# Patient Record
Sex: Female | Born: 1995
Health system: Southern US, Community
[De-identification: ages and names within clinical notes are randomized; demographics above are authoritative.]

---
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
## 2017-05-05 ENCOUNTER — Ambulatory Visit (INDEPENDENT_AMBULATORY_CARE_PROVIDER_SITE_OTHER): Payer: No Typology Code available for payment source | Admitting: Family Medicine

## 2017-05-05 ENCOUNTER — Other Ambulatory Visit: Payer: Self-pay

## 2017-05-05 ENCOUNTER — Encounter: Payer: Self-pay | Admitting: Family Medicine

## 2017-05-05 VITALS — BP 110/62 | HR 109 | Temp 99.8°F | Resp 16 | Ht 60.25 in | Wt 156.0 lb

## 2017-05-05 DIAGNOSIS — J069 Acute upper respiratory infection, unspecified: Secondary | ICD-10-CM | POA: Diagnosis not present

## 2017-05-05 DIAGNOSIS — J301 Allergic rhinitis due to pollen: Secondary | ICD-10-CM

## 2017-05-05 MED ORDER — FLUTICASONE PROPIONATE 50 MCG/ACT NA SUSP: 2.0000 | Freq: Every day | NASAL | 6 refills | Status: DC

## 2017-05-05 MED ORDER — CETIRIZINE HCL 10 MG PO TABS: 10.0000 mg | ORAL_TABLET | Freq: Every day | ORAL | 11 refills | Status: DC

## 2017-05-05 NOTE — Progress Notes (Signed)
Chief Complaint  Patient presents with  . URI onset: 05/02/2017    cold symptoms, no fevers noticed, pt is having menses as welll and says she is warmer during menses.  Symptoms have been intermittent, bothers her more at night.   Coughing, sneezing, congestion and stuffy, non productive cough.  Taking nyquil for symptoms helps some but symptoms come back.  ? allergies    HPI  Pt reports that she has been having coughing, sneezing, congestion, stuffy nose, dry cough Onset 05/02/17 She has a history of seasonal allergies She is currently menstruating and feels very tired She tried otc cold meds without much improvement  Patient's last menstrual period was 05/02/2017.    History reviewed. No pertinent past medical history.  Current Outpatient Medications  Medication Sig Dispense Refill  . cetirizine (ZYRTEC) 10 MG tablet Take 1 tablet (10 mg total) by mouth daily. 30 tablet 11  . fluticasone (FLONASE) 50 MCG/ACT nasal spray Place 2 sprays into both nostrils daily. 16 g 6  . Multiple Vitamin (MULTIVITAMIN) capsule Take 2 capsules by mouth daily. Gummy-vites     No current facility-administered medications for this visit.     Allergies: No Known Allergies  History reviewed. No pertinent surgical history.  Social History   Socioeconomic History  . Marital status: Single    Spouse name: Not on file  . Number of children: Not on file  . Years of education: Not on file  . Highest education level: Not on file  Occupational History  . Not on file  Social Needs  . Financial resource strain: Not on file  . Food insecurity:    Worry: Not on file    Inability: Not on file  . Transportation needs:    Medical: Not on file    Non-medical: Not on file  Tobacco Use  . Smoking status: Never Smoker  . Smokeless tobacco: Never Used  Substance and Sexual Activity  . Alcohol use: No  . Drug use: No  . Sexual activity: Not on file  Lifestyle  . Physical activity:    Days per week:  Not on file    Minutes per session: Not on file  . Stress: Not on file  Relationships  . Social connections:    Talks on phone: Not on file    Gets together: Not on file    Attends religious service: Not on file    Active member of club or organization: Not on file    Attends meetings of clubs or organizations: Not on file    Relationship status: Not on file  Other Topics Concern  . Not on file  Social History Narrative  . Not on file    History reviewed. No pertinent family history.   ROS Review of Systems See HPI Constitution: No fevers or chills No malaise No diaphoresis Skin: No rash or itching Eyes: no blurry vision, no double vision GU: no dysuria or hematuria Neuro: no dizziness or headaches * all others reviewed and negative   Objective: Vitals:   05/05/17 1651  BP: 110/62  Pulse: (!) 109  Resp: 16  Temp: 99.8 F (37.7 C)  TempSrc: Oral  SpO2: 99%  Weight: 156 lb (70.8 kg)  Height: 5' 0.25" (1.53 m)    Physical Exam General: alert, oriented, in NAD Head: normocephalic, atraumatic, no sinus tenderness Eyes: EOM intact, no scleral icterus or conjunctival injection Ears: TM clear bilaterally Nose: mucosa +erythematous,+edematous Throat: no pharyngeal exudate or erythema Lymph: no posterior auricular, submental  or cervical lymph adenopathy Heart: normal rate, normal sinus rhythm, no murmurs Lungs: clear to auscultation bilaterally, no wheezing    Assessment and Plan Gloria Schneider was seen today for uri onset: 05/02/2017.  Diagnoses and all orders for this visit:  Acute URI Seasonal allergic rhinitis due to pollen  Supportive care advised No abx indicated  -     fluticasone (FLONASE) 50 MCG/ACT nasal spray; Place 2 sprays into both nostrils daily. -     cetirizine (ZYRTEC) 10 MG tablet; Take 1 tablet (10 mg total) by mouth daily.     Tanique Matney A Javonnie Illescas

## 2017-05-05 NOTE — Patient Instructions (Addendum)
   IF you received an x-ray today, you will receive an invoice from [facility]. Please contact [facility] at [phone] with questions or concerns regarding your invoice.   IF you received labwork today, you will receive an invoice from [facility]. Please contact [facility] at [phone] with questions or concerns regarding your invoice.   Our billing staff will not be able to assist you with questions regarding bills from these companies.  You will be contacted with the lab results as soon as they are available. The fastest way to get your results is to activate your My Chart account. Instructions are located on the last page of this paperwork. If you have not heard from us regarding the results in 2 weeks, please contact this office.    Allergic Rhinitis, Adult Allergic rhinitis is an allergic reaction that affects the mucous membrane inside the nose. It causes sneezing, a runny or stuffy nose, and the feeling of mucus going down the back of the throat (postnasal drip). Allergic rhinitis can be mild to severe. There are two types of allergic rhinitis:  Seasonal. This type is also called hay fever. It happens only during certain seasons.  Perennial. This type can happen at any time of the year.  What are the causes? This condition happens when the body's defense system (immune system) responds to certain harmless substances called allergens as though they were germs.  Seasonal allergic rhinitis is triggered by pollen, which can come from grasses, trees, and weeds. Perennial allergic rhinitis may be caused by:  House dust mites.  Pet dander.  Mold spores.  What are the signs or symptoms? Symptoms of this condition include:  Sneezing.  Runny or stuffy nose (nasal congestion).  Postnasal drip.  Itchy nose.  Tearing of the eyes.  Trouble sleeping.  Daytime sleepiness.  How is this diagnosed? This condition may be diagnosed based on:  Your medical  history.  A physical exam.  Tests to check for related conditions, such as: ? Asthma. ? Pink eye. ? Ear infection. ? Upper respiratory infection.  Tests to find out which allergens trigger your symptoms. These may include skin or blood tests.  How is this treated? There is no cure for this condition, but treatment can help control symptoms. Treatment may include:  Taking medicines that block allergy symptoms, such as antihistamines. Medicine may be given as a shot, nasal spray, or pill.  Avoiding the allergen.  Desensitization. This treatment involves getting ongoing shots until your body becomes less sensitive to the allergen. This treatment may be done if other treatments do not help.  If taking medicine and avoiding the allergen does not work, new, stronger medicines may be prescribed.  Follow these instructions at home:  Find out what you are allergic to. Common allergens include smoke, dust, and pollen.  Avoid the things you are allergic to. These are some things you can do to help avoid allergens: ? Replace carpet with wood, tile, or vinyl flooring. Carpet can trap dander and dust. ? Do not smoke. Do not allow smoking in your home. ? Change your heating and air conditioning filter at least once a month. ? During allergy season:  Keep windows closed as much as possible.  Plan outdoor activities when pollen counts are lowest. This is usually during the evening hours.  When coming indoors, change clothing and shower before sitting on furniture or bedding.  Take over-the-counter and prescription medicines only as told by your health care provider.  Keep all   follow-up visits as told by your health care provider. This is important. Contact a health care provider if:  You have a fever.  You develop a persistent cough.  You make whistling sounds when you breathe (you wheeze).  Your symptoms interfere with your normal daily activities. Get help right away if:  You  have shortness of breath. Summary  This condition can be managed by taking medicines as directed and avoiding allergens.  Contact your health care provider if you develop a persistent cough or fever.  During allergy season, keep windows closed as much as possible. This information is not intended to replace advice given to you by your health care provider. Make sure you discuss any questions you have with your health care provider. Document Released: 10/20/2000 Document Revised: 03/04/2016 Document Reviewed: 03/04/2016 Elsevier Interactive Patient Education  2018 [employer] Inc[employer]  

## 2017-09-19 ENCOUNTER — Other Ambulatory Visit: Payer: Self-pay

## 2017-09-19 ENCOUNTER — Encounter (HOSPITAL_BASED_OUTPATIENT_CLINIC_OR_DEPARTMENT_OTHER): Payer: Self-pay | Admitting: Emergency Medicine

## 2017-09-19 ENCOUNTER — Emergency Department (HOSPITAL_BASED_OUTPATIENT_CLINIC_OR_DEPARTMENT_OTHER)
Admission: EM | Admit: 2017-09-19 | Discharge: 2017-09-19 | Disposition: A | Discharge status: Home / Self Care | Payer: No Typology Code available for payment source | Attending: Emergency Medicine | Admitting: Emergency Medicine

## 2017-09-19 DIAGNOSIS — Y9389 Activity, other specified: Secondary | ICD-10-CM | POA: Insufficient documentation

## 2017-09-19 DIAGNOSIS — Y999 Unspecified external cause status: Secondary | ICD-10-CM | POA: Insufficient documentation

## 2017-09-19 DIAGNOSIS — Z79899 Other long term (current) drug therapy: Secondary | ICD-10-CM | POA: Diagnosis not present

## 2017-09-19 DIAGNOSIS — M549 Dorsalgia, unspecified: Secondary | ICD-10-CM | POA: Insufficient documentation

## 2017-09-19 DIAGNOSIS — Y92129 Unspecified place in nursing home as the place of occurrence of the external cause: Secondary | ICD-10-CM | POA: Insufficient documentation

## 2017-09-19 DIAGNOSIS — X500XXA Overexertion from strenuous movement or load, initial encounter: Secondary | ICD-10-CM | POA: Diagnosis not present

## 2017-09-19 MED ORDER — METHOCARBAMOL 500 MG PO TABS: 500.0000 mg | ORAL_TABLET | Freq: Every evening | ORAL | 0 refills | Status: DC | PRN

## 2017-09-19 MED ORDER — MELOXICAM 15 MG PO TABS: 15.0000 mg | ORAL_TABLET | Freq: Every day | ORAL | 0 refills | Status: DC

## 2017-09-19 MED FILL — MELOXICAM 15 MG TABLET: 15 | 30 days supply | Qty: 30 | Fill #0

## 2017-09-19 MED FILL — METHOCARBAMOL 500 MG TABLET: 500 | 14 days supply | Qty: 14 | Fill #0

## 2017-09-19 NOTE — ED Notes (Signed)
Pt. Able to move her Neck L to R and move her L arm with no difficulty.  Pt. Talks non stop about her job being difficult with issues of her not having help and being uncomfortable with her position in her role with the care she has to provide.  Pt. Continues to discuss the issues of the job not the reason she is here and the back pain she came to the ED for.  EDP redirects the Pt. To her reason for being here.

## 2017-09-19 NOTE — Discharge Instructions (Addendum)
You were seen here today for upper back pain. Your exam was reassuring. Follow attached handout.  Please take medications as prescribed. Do not drive while taking robaxin. Please follow up with sports medicine as needed if symptoms continue.  Return for fever, new weakness, new numbness, loss of bowel or bladder control or other concerning symptoms.

## 2017-09-19 NOTE — ED Triage Notes (Signed)
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
## 2017-09-19 NOTE — ED Notes (Signed)
ED Provider at bedside. 

## 2017-09-19 NOTE — ED Provider Notes (Signed)
[facility] EMERGENCY DEPARTMENT Provider Note   CSN: [id number] Arrival date & time: 09/19/17  1310     History   Chief Complaint Chief Complaint  Patient presents with  . Back Pain    HPI Gloria Schneider M Taulbee is a 22 y.o. female with no significant past medical history presents to the emergency department today for left upper back pain yesterday.  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  She reports that since that time she has been having pain of her left upper back just underneath her scapula.  She notes it is worse with palpation as well as certain movements of her shoulder.  She denies any neck pain, shoulder pain or midline back pain.  No bowel/bladder incontinence, urinary retention, numbness/tingling/weakness of the upper or lower extremities.  Patient is taking Aleve for symptoms of mild relief.  She continually reports how she feels comfortable at work and has to be redirected.  No other complaints at this time.  HPI  History reviewed. No pertinent past medical history.  There are no active problems to display for this patient.   History reviewed. No pertinent surgical history.   OB History   None      Home Medications    Prior to Admission medications   Medication Sig Start Date End Date Taking? Authorizing Provider  cetirizine (ZYRTEC) 10 MG tablet Take 1 tablet (10 mg total) by mouth daily. 05/05/17   Doristine BosworthStallings, Zoe A, MD  fluticasone (FLONASE) 50 MCG/ACT nasal spray Place 2 sprays into both nostrils daily. 05/05/17   Doristine BosworthStallings, Zoe A, MD  Multiple Vitamin (MULTIVITAMIN) capsule Take 2 capsules by mouth daily. Gummy-vites    [provider]    Family History History reviewed. No pertinent family history.  Social History Social History   Tobacco Use  . Smoking status: Never Smoker  . Smokeless tobacco: Never Used  Substance Use Topics  . Alcohol  use: No  . Drug use: No     Allergies   Patient has no known allergies.   Review of Systems Review of Systems  All other systems reviewed and are negative.    Physical Exam Updated Vital Signs BP 126/84 (BP Location: Left Arm)   Pulse 91   Resp 18   Ht 5' (1.524 m)   Wt 65.8 kg   SpO2 100%   BMI 28.32 kg/m   Physical Exam  Constitutional: She appears well-developed and well-nourished.  HENT:  Head: Normocephalic and atraumatic.  Right Ear: External ear normal.  Left Ear: External ear normal.  Eyes: Conjunctivae are normal. Right eye exhibits no discharge. Left eye exhibits no discharge. No scleral icterus.  Neck: No JVD present. Carotid bruit is not present.  No C-spine tenderness palpation or step-offs.  Cardiovascular:  Pulses:      Radial pulses are 2+ on the right side, and 2+ on the left side.  Pulmonary/Chest: Effort normal. No respiratory distress.  Musculoskeletal:       Left shoulder: Normal. She exhibits normal range of motion and no tenderness.       Cervical back: Normal.       Thoracic back: She exhibits tenderness. She exhibits no swelling.       Lumbar back: Normal.       Back:  No C, T, L-spine tenderness palpation or step-offs.  Neurological: She is alert. She has normal  strength. No sensory deficit.  Normal range of motion of bilateral upper extremities.  She has intact sensation to light touch.  Normal strength of bilateral shoulders with abduction.  Skin: Skin is warm, dry and intact. Capillary refill takes less than 2 seconds. No pallor.  Psychiatric: She has a normal mood and affect.  Nursing note and vitals reviewed.    ED Treatments / Results  Labs (all labs ordered are listed, but only abnormal results are displayed) Labs Reviewed - No data to display  EKG None  Radiology No results found.  Procedures Procedures (including critical care time)  Medications Ordered in ED Medications - No data to display   Initial  Impression / Assessment and Plan / ED Course  I have reviewed the triage vital signs and the nursing notes.  Pertinent labs & imaging results that were available during my care of the patient were reviewed by me and considered in my medical decision making (see chart for details).     22 y.o. with pain over here aspect of her left scapula after catching patient was falling the other day.  She has no bony tenderness.  She denies any neurologic symptoms.  She has normal neurologic exam.  She is neurovascular intact. No bowel/bladder incontinence.  No concern for cauda equina.  Suspect musculoskeletal pain.  No imaging negative.  Recommended rest, conservative therapy and follow-up with sports medicine versus PCP.  Return precautions discussed.  Patient appears safe for discharge.  Final Clinical Impressions(s) / ED Diagnoses   Final diagnoses:  Upper back pain on left side    ED Discharge Orders         Ordered    meloxicam (MOBIC) 15 MG tablet  Daily     09/19/17 1519    methocarbamol (ROBAXIN) 500 MG tablet  At bedtime PRN     09/19/17 1519           Princella PellegriniMaczis, Sundra Haddix M, PA-C 09/19/17 1540    Tegeler, Canary Brimhristopher J, MD 09/19/17 [phone]

## 2017-09-20 ENCOUNTER — Other Ambulatory Visit: Payer: Self-pay

## 2017-09-20 ENCOUNTER — Telehealth: Payer: Self-pay

## 2017-09-20 ENCOUNTER — Encounter: Payer: Self-pay | Admitting: Family Medicine

## 2017-09-20 ENCOUNTER — Ambulatory Visit (INDEPENDENT_AMBULATORY_CARE_PROVIDER_SITE_OTHER): Payer: Worker's Compensation | Admitting: Family Medicine

## 2017-09-20 VITALS — BP 124/86 | HR 100 | Temp 98.6°F | Ht 60.0 in | Wt 142.2 lb

## 2017-09-20 DIAGNOSIS — S29012A Strain of muscle and tendon of back wall of thorax, initial encounter: Secondary | ICD-10-CM | POA: Diagnosis not present

## 2017-09-20 DIAGNOSIS — M549 Dorsalgia, unspecified: Secondary | ICD-10-CM | POA: Diagnosis not present

## 2017-09-20 NOTE — Telephone Encounter (Signed)
Pt was given Januvia 100mg . Medication is 444.00 with insurance. Requesting a different medication

## 2017-09-20 NOTE — Progress Notes (Signed)
8/13/20193:42 PM  Dimitri PedWiata M Marlar 07-Dec-1995, 22 y.o. female [id number]  Chief Complaint  Patient presents with  . Back Pain    injured at work while lifting a patient. Works at [employer]. While bathing a pt, pt slipped which hurt her back. The 2nd incident a pt fell while she was trying to lift her for bathroom visit. Having pain in the upper back. Taking aleave for the painj Has no problem with going to work, just needs restrictions    HPI:   Patient is a 22 y.o. 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   Fall Risk  09/20/2017 05/05/2017  Falls in the past year? No No     Dep[facility] screen [facility] 09/20/2017 05/05/2017  Decreased Interest 0 0  Down, Depressed, Hopeless 0 0  PHQ - 2 Score 0 0    No Known Allergies  Prior to Admission medications   Medication Sig Start Date End Date Taking? Authorizing Provider  meloxicam (MOBIC) 15 MG tablet Take 1 tablet (15 mg total) by mouth daily. 09/19/17  Yes Maczis, Elmer SowMichael M, PA-C  methocarbamol (ROBAXIN) 500 MG tablet Take 1 tablet (500 mg total) by mouth at bedtime as needed for muscle spasms. 09/19/17  Yes Maczis, Elmer SowMichael M, PA-C  cetirizine (ZYRTEC) 10 MG tablet Take 1 tablet (10 mg total) by mouth daily. Patient not taking: Reported on 09/20/2017 05/05/17   Doristine BosworthStallings, Zoe A, MD  Multiple Vitamin (MULTIVITAMIN) capsule Take 2 capsules by mouth daily. Gummy-vites    [provider]    History reviewed. No pertinent past medical  history.  History reviewed. No pertinent surgical history.  Social History   Tobacco Use  . Smoking status: Never Smoker  . Smokeless tobacco: Never Used  Substance Use Topics  . Alcohol use: No    History reviewed. No pertinent family history.  ROS Per hpi  OBJECTIVE:  Blood pressure 124/86, pulse 100, temperature 98.6 F (37 C), temperature source Oral, height 5' (1.524 m), weight 142 lb 3.2 oz (64.5 kg), last menstrual period 09/17/2017, SpO2 100 %. Body mass index is 27.77 kg/m.   Physical Exam  Constitutional: She is oriented to person, place, and time. She appears well-developed and well-nourished.  HENT:  Head: Normocephalic and atraumatic.  Mouth/Throat: Oropharynx is clear and moist. No oropharyngeal exudate.  Eyes: Pupils are equal, round, and reactive to light. EOM are normal. No scleral icterus.  Neck: Neck supple.  Cardiovascular: Normal rate, regular rhythm and normal heart sounds. Exam reveals no gallop and no friction rub.  No murmur heard. Pulmonary/Chest: Effort normal and breath sounds normal. She has no wheezes. She has no rales.  Musculoskeletal: She exhibits no edema.       Cervical back: Normal.       Thoracic back: She exhibits tenderness. She exhibits no bony tenderness.       Back:  Neurological: She is alert and oriented to person, place, and time.  Skin: Skin is warm and dry.  Nursing note and vitals reviewed.   ASSESSMENT and PLAN  1. Upper back pain on left side 2. Muscle strain of left upper back, initial encounter Continue with conservative measures, take meds rx by urgent care as needed. Work restrictions for no lifting more than 10 lbs over next 2 weeks given. Patient educational handout given.   Return in about 2 weeks (around 10/04/2017).    Myles LippsIrma M Santiago, MD Primary [facility] at [facility] [address] [place], [place] [address] Ph.  [phone] Fax [phone]

## 2017-09-20 NOTE — Telephone Encounter (Signed)
error 

## 2017-09-20 NOTE — Patient Instructions (Signed)
  I will contact you with your lab results within the next 2 weeks.  If you have not heard from us then please contact us. The fastest way to get your results is to register for My Chart.   IF you received an x-ray today, you will receive an invoice from [facility]. Please contact [facility] at [phone] with questions or concerns regarding your invoice.   IF you received labwork today, you will receive an invoice from [facility]. Please contact [facility] at [phone] with questions or concerns regarding your invoice.   Our billing staff will not be able to assist you with questions regarding bills from these companies.  You will be contacted with the lab results as soon as they are available. The fastest way to get your results is to activate your My Chart account. Instructions are located on the last page of this paperwork. If you have not heard from us regarding the results in 2 weeks, please contact this office.     

## 2017-09-21 ENCOUNTER — Encounter: Payer: Self-pay | Admitting: Family Medicine

## 2017-10-05 ENCOUNTER — Telehealth: Payer: Self-pay | Admitting: Family Medicine

## 2017-10-05 ENCOUNTER — Other Ambulatory Visit: Payer: Self-pay

## 2017-10-05 ENCOUNTER — Ambulatory Visit (INDEPENDENT_AMBULATORY_CARE_PROVIDER_SITE_OTHER): Payer: Worker's Compensation | Admitting: Family Medicine

## 2017-10-05 ENCOUNTER — Encounter: Payer: Self-pay | Admitting: Family Medicine

## 2017-10-05 VITALS — BP 115/87 | HR 100 | Temp 98.6°F | Ht 60.0 in | Wt 146.0 lb

## 2017-10-05 DIAGNOSIS — M549 Dorsalgia, unspecified: Secondary | ICD-10-CM | POA: Diagnosis not present

## 2017-10-05 DIAGNOSIS — S29012D Strain of muscle and tendon of back wall of thorax, subsequent encounter: Secondary | ICD-10-CM

## 2017-10-05 NOTE — Progress Notes (Signed)
   8/28/20193:40 PM  Dimitri PedWiata M Fiscus Aug 05, 1995, 22 y.o. female [id number]  Chief Complaint  Patient presents with  . Back Pain    no longer taking the Mobic due to how it makes her feel. Never started the Robaxin. Using heat and taking aleeve for the pain.     HPI:   Patient is a 22 y.o. female who presents today for left upper back pain from work related incidents around early august 2019  Last seen Sep 20 2017  Took mobic at bedtime, slept really well, but was very groogy the next days Has not taken muscle relaxant Doing heating pads, stretching, aleve Still feels stiff specially if bending over  Has not heard back from work regarding when to return to work   Fall Risk  10/05/2017 09/20/2017 05/05/2017  Falls in the past year? No No No     [facility] 10/05/2017 09/20/2017 05/05/2017  Decreased Interest 0 0 0  Down, Depressed, Hopeless 0 0 0  PHQ - 2 Score 0 0 0    No Known Allergies  Prior to Admission medications   Medication Sig Start Date End Date Taking? Authorizing Provider  Multiple Vitamin (MULTIVITAMIN) capsule Take 2 capsules by mouth daily. Gummy-vites   Yes [provider]  meloxicam (MOBIC) 15 MG tablet Take 1 tablet (15 mg total) by mouth daily. Patient not taking: Reported on 10/05/2017 09/19/17   Maczis, Elmer SowMichael M, PA-C  methocarbamol (ROBAXIN) 500 MG tablet Take 1 tablet (500 mg total) by mouth at bedtime as needed for muscle spasms. Patient not taking: Reported on 10/05/2017 09/19/17   Jacinto HalimMaczis, Michael M, PA-C    History reviewed. No pertinent past medical history.  History reviewed. No pertinent surgical history.  Social History   Tobacco Use  . Smoking status: Never Smoker  . Smokeless tobacco: Never Used  Substance Use Topics  . Alcohol use: No    History reviewed. No pertinent family history.  ROS Per hpi  OBJECTIVE:  Blood pressure 115/87, pulse 100, temperature 98.6 F (37 C), temperature source Oral, height 5'  (1.524 m), weight 146 lb (66.2 kg), last menstrual period 09/17/2017, SpO2 99 %. Body mass index is 28.51 kg/m.   Physical Exam  Constitutional: She is oriented to person, place, and time. She appears well-developed and well-nourished.  HENT:  Head: Normocephalic and atraumatic.  Mouth/Throat: Mucous membranes are normal.  Eyes: Pupils are equal, round, and reactive to light. Conjunctivae and EOM are normal. No scleral icterus.  Neck: Neck supple.  Pulmonary/Chest: Effort normal.  Musculoskeletal:       Thoracic back: She exhibits decreased range of motion, tenderness, swelling and spasm. She exhibits no bony tenderness.       Back:  Neurological: She is alert and oriented to person, place, and time.  Skin: Skin is warm and dry.  Psychiatric: She has a normal mood and affect.  Nursing note and vitals reviewed.   ASSESSMENT and PLAN  1. Upper back pain on left side 2. Muscle strain of left upper back, subsequent encounter - Ambulatory referral to Physical Therapy Continue with supportive measures. Referring to PT to further eval and treat.   Return in about 4 weeks (around 11/02/2017).    Myles LippsIrma M Santiago, MD [facility] at [facility] [address] [place], [place] [address] Ph.  [phone] Fax [phone]

## 2017-10-05 NOTE — Patient Instructions (Signed)
° ° ° °  If you have lab work done today you will be contacted with your lab results within the next 2 weeks.  If you have not heard from us then please contact us. The fastest way to get your results is to register for My Chart. ° ° °IF you received an x-ray today, you will receive an invoice from [facility]. Please contact [facility] at [phone] with questions or concerns regarding your invoice.  ° °IF you received labwork today, you will receive an invoice from [facility]. Please contact [facility] at [phone] with questions or concerns regarding your invoice.  ° °Our billing staff will not be able to assist you with questions regarding bills from these companies. ° °You will be contacted with the lab results as soon as they are available. The fastest way to get your results is to activate your My Chart account. Instructions are located on the last page of this paperwork. If you have not heard from us regarding the results in 2 weeks, please contact this office. °  ° ° ° °

## 2017-10-05 NOTE — Telephone Encounter (Signed)
Pt w/c adjustor is Gloria Schneider telephone number is [phone] and fax is [phone] contact for any referral send outs

## 2017-10-31 ENCOUNTER — Encounter: Payer: Self-pay | Admitting: Rehabilitative and Restorative Service Providers"

## 2017-10-31 ENCOUNTER — Ambulatory Visit (INDEPENDENT_AMBULATORY_CARE_PROVIDER_SITE_OTHER): Payer: No Typology Code available for payment source | Admitting: Rehabilitative and Restorative Service Providers"

## 2017-10-31 DIAGNOSIS — R293 Abnormal posture: Secondary | ICD-10-CM

## 2017-10-31 DIAGNOSIS — R29898 Other symptoms and signs involving the musculoskeletal system: Secondary | ICD-10-CM | POA: Diagnosis not present

## 2017-10-31 DIAGNOSIS — M546 Pain in thoracic spine: Secondary | ICD-10-CM
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
## 2017-10-31 NOTE — Patient Instructions (Signed)
Access Code: [email or web address]  URL: [email or web address]  Date: 10/31/2017  Prepared by: Corlis Leakelyn Damire Remedios   Exercises  Seated Cervical Retraction - 10 reps - 1 sets - 3x daily - 7x weekly  Standing Scapular Retraction - 10 reps - 1 sets - 10 hold - 3x daily - 7x weekly  Shoulder External Rotation and Scapular Retraction - 10 reps - 1 sets - hold - 3x daily - 7x weekly  Standing Scapular Retraction in Abduction - 10 reps - 1 sets - 3x daily - 7x weekly  Doorway Pec Stretch at 60 Degrees Abduction - 3 reps - 1 sets - 3x daily - 7x weekly  Doorway Pec Stretch at 90 Degrees Abduction - 3 reps - 1 sets - 30 seconds hold - 3x daily - 7x weekly  Doorway Pec Stretch at 120 Degrees Abduction - 3 reps - 1 sets - 30 second hold hold - 3x daily - 7x weekly  Patient Education  TENS UNIT

## 2017-11-04 ENCOUNTER — Encounter: Payer: Self-pay | Admitting: Physical Therapy

## 2017-11-04 ENCOUNTER — Ambulatory Visit (INDEPENDENT_AMBULATORY_CARE_PROVIDER_SITE_OTHER): Payer: No Typology Code available for payment source | Admitting: Physical Therapy

## 2017-11-04 DIAGNOSIS — R293 Abnormal posture: Secondary | ICD-10-CM | POA: Diagnosis not present

## 2017-11-04 DIAGNOSIS — R29898 Other symptoms and signs involving the musculoskeletal system: Secondary | ICD-10-CM | POA: Diagnosis not present

## 2017-11-04 DIAGNOSIS — M546 Pain in thoracic spine: Secondary | ICD-10-CM | POA: Diagnosis not present

## 2017-11-04 NOTE — Patient Instructions (Signed)
Sash   On back, knees bent, feet flat, left hand on left hip, right hand above left. Pull right arm DIAGONALLY (hip to shoulder) across chest. Bring right arm along head toward floor. Hold momentarily. Slowly return to starting position. Repeat _10__ times, 2 sets. Do with left, then right arm. Band color __red____   Resisted External Rotation: in Neutral - Bilateral  PALMS UP!!! Sit or stand, tubing in both hands, elbows at sides, bent to 90, forearms forward. Pinch shoulder blades together and rotate forearms out. Keep elbows at sides. Repeat __10__ times per set. Do __2-3__ sets per session. Do __3-4__ sessions per week.  Resistive Band Rowing   With resistive band anchored in door, grasp both ends. Keeping elbows bent, pull back, squeezing shoulder blades together. Hold _3-5___ seconds. Repeat _10-30___ times. Do __3-4__ sessions per week.  Strengthening: Resisted Extension    Hold tubing with both hands, arms forward. Pull arms back, elbow straight. Repeat _10-30___ times per set. Do ____ sets per session. Do _3-4___ sessions per week.   [facility] [address] [place], [place] [address]  [phone] (office) [phone] (fax) .

## 2017-11-04 NOTE — Therapy (Signed)
[facility] [place] [address] [place], [place], [address] Phone: [phone]   Fax:  [phone]  Physical Therapy Treatment  Patient Details  Name: Gloria Schneider MRN: [id number] Date of Birth: 08-15-1995 Referring Provider (PT): Dr Koren Shiver    Encounter Date: 11/04/2017  PT End of Session - 11/04/17 1148    Visit Number  2    Number of Visits  12    Date for PT Re-Evaluation  12/12/17    PT Start Time  1149    PT Stop Time  1236    PT Time Calculation (min)  47 min    Activity Tolerance  Patient tolerated treatment well;No increased pain    Behavior During Therapy  [facility] for tasks assessed/performed       History reviewed. No pertinent past medical history.  History reviewed. No pertinent surgical history.  There were no vitals filed for this visit.  Subjective Assessment - 11/04/17 1153    Subjective  Pt reports she hasn't had much time to do the exercise. She is feeling a little better.  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     Currently in Pain?  No/denies    Pain Score  0-No pain    Pain Location  Thoracic    Pain Orientation  Left       [facility] Adult PT Treatment/Exercise - 11/04/17 0001      Exercises   Exercises  Shoulder      Shoulder Exercises: Supine   Other Supine Exercises  Lt sash with yellow band x 10 reps       Shoulder Exercises: Seated   Other Seated Exercises  thoracic extension over back of chair x 5 sec x 4 reps      Shoulder Exercises: Standing   External Rotation  Strengthening;Both;10 reps;Theraband    Theraband Level (Shoulder External Rotation)  Level 1 (Yellow)    Extension  Strengthening;Both;10 reps;Theraband    Theraband Level (Shoulder Extension)  Level 1 (Yellow)    Row  Strengthening;Both;10 reps;Theraband    Theraband Level (Shoulder Row)  Level 2 (Red)    Other Standing Exercises  scap squeeze 10 sec x 10; chin tuck  (demo and cues for  form) 5 sec x 10;  W's x 10 with noodle       Shoulder Exercises: ROM/Strengthening   UBE (Upper Arm Bike)  L1: 1.5 min forward/ 1.5 min backward      Shoulder Exercises: Stretch   Other Shoulder Stretches  3 way pec stretch 30 sec x 2 reps each position - cues for technique and form.       Moist Heat Therapy   Number Minutes Moist Heat  15 Minutes    Moist Heat Location  Shoulder   upper thoracic      Electrical Stimulation   Electrical Stimulation Location  Lt posterior shoulder girdle/scapular area     Electrical Stimulation Action  IFC    Electrical Stimulation Parameters  to tolerance    Electrical Stimulation Goals  Pain;Tone                  PT Long Term Goals - 10/31/17 1236      PT LONG TERM GOAL #1   Title  Decrease pain in Lt thoracic area by 50-75% allowing patient to return to normal funcitonal and work activities 12/12/17    Time  6    Period  Weeks  Status  New      PT LONG TERM GOAL #2   Title  Full pain free ROM through bilat UE's and cervical spine 12/12/17    Time  6    Period  Weeks    Status  New      PT LONG TERM GOAL #3   Title  Improve posture and alignment with patient to demonstrate improved upright posture with posterior shoudler girdle engaged 12/12/17    Time  6    Period  Weeks    Status  New      PT LONG TERM GOAL #4   Title  Independent in HEP 12/12/17    Time  6    Period  Weeks      PT LONG TERM GOAL #5   Title  Improve FOTO to </= 34% limitation 12/12/17    Time  6    Period  Weeks    Status  New            Plan - 11/04/17 1234    Clinical Impression Statement  Pt required some cues on proper posture/form during exercise.  She tolerated all well, without increase in symptoms. Encouraged pt to be more compliant with HEP to assist with meeting goals.     Rehab Potential  Good    PT Frequency  2x / week    PT Duration  6 weeks    PT Treatment/Interventions  Patient/family education;ADLs/Self Care Home  Management;Cryotherapy;Electrical Stimulation;Iontophoresis 4mg /ml Dexamethasone;Moist Heat;Ultrasound;Dry needling;Manual techniques;Neuromuscular re-education;Therapeutic activities;Fluidtherapy;Therapeutic exercise    PT Next Visit Plan  add cross body stretch; assess response to new exercises.     Consulted and Agree with Plan of Care  Patient       Patient will benefit from skilled therapeutic intervention in order to improve the following deficits and impairments:  Postural dysfunction, Improper body mechanics, Pain, Increased fascial restricitons, Increased muscle spasms, Decreased scar mobility, Decreased range of motion, Decreased activity tolerance, Decreased endurance  Visit Diagnosis: Pain in thoracic spine  Other symptoms and signs involving the musculoskeletal system  Abnormal posture     Problem List There are no active problems to display for this patient.  Mayer Camel, PTA 11/04/17 12:44 PM  [facility] [place] [address] [place], [place], [address] Phone: [phone]   Fax:  [phone]  Name: [name] MRN: [id number] Date of Birth: May 29, 1995

## 2017-11-07 ENCOUNTER — Telehealth: Payer: Self-pay | Admitting: Family Medicine

## 2017-11-07 NOTE — Telephone Encounter (Signed)
Copied from CRM [id number]. Topic: Appointment Scheduling - Scheduling Inquiry for Clinic >> Nov 07, 2017  8:46 AM Windy Kalata, NT wrote: Reason for CRM: patient is calling and states she finishes her 4th round of physical therapy on 11/11/17 and was told to follow up with Dr. Leretha Pol to see if she needed further therapy. Dr. Leretha Pol first available is 11/28/17 and patient states she needs to be seen sooner for a follow up. Please contact patient for scheduling.

## 2017-11-08 NOTE — Telephone Encounter (Signed)
Is this acceptable appointment time or do you want her seen sooner

## 2017-11-08 NOTE — Telephone Encounter (Signed)
She was seen on the 27th of sept for her 2nd PT session. She is scheduled for 12 sessions, so that later October appt makes sense. I do not need to see her sooner than that. thanks

## 2017-11-09 NOTE — Telephone Encounter (Signed)
Pt is requesting call back from [facility] to discuss VM after 12:00pm tomorrow.

## 2017-11-09 NOTE — Telephone Encounter (Signed)
LMOVM with Dr. Adela Glimpse message.

## 2017-11-10 ENCOUNTER — Encounter: Payer: Self-pay | Admitting: Physical Therapy

## 2017-11-10 ENCOUNTER — Ambulatory Visit (INDEPENDENT_AMBULATORY_CARE_PROVIDER_SITE_OTHER): Payer: No Typology Code available for payment source | Admitting: Physical Therapy

## 2017-11-10 DIAGNOSIS — R293 Abnormal posture: Secondary | ICD-10-CM

## 2017-11-10 DIAGNOSIS — M546 Pain in thoracic spine: Secondary | ICD-10-CM | POA: Diagnosis not present

## 2017-11-10 DIAGNOSIS — R29898 Other symptoms and signs involving the musculoskeletal system: Secondary | ICD-10-CM | POA: Diagnosis not present

## 2017-11-10 NOTE — Patient Instructions (Signed)
Kinesiology tape What is kinesiology tape?  There are many brands of kinesiology tape.  KTape, Rock [employer], [employer], Dynamic tape, to name a few. It is an elasticized tape designed to support the body's natural healing process. This tape provides stability and support to muscles and joints without restricting motion. It can also help decrease swelling in the area of application. How does it work? The tape microscopically lifts and decompresses the skin to allow for drainage of lymph (swelling) to flow away from area, reducing inflammation.  The tape has the ability to help re-educate the neuromuscular system by targeting specific receptors in the skin.  The presence of the tape increases the body's awareness of posture and body mechanics.  Do not use with: . Open wounds . Skin lesions . Adhesive allergies Safe removal of the tape: In some rare cases, mild/moderate skin irritation can occur.  This can include redness, itchiness, or hives. If this occurs, immediately remove tape and consult your primary care physician if symptoms are severe or do not resolve within 2 days.  To remove tape safely, hold nearby skin with one hand and gentle roll tape down with other hand.  You can apply oil or conditioner to tape while in shower prior to removal to loosen adhesive.  DO NOT swiftly rip tape off like a band-aid, as this could cause skin tears and additional skin irritation.   Scapular Retraction: Abduction (Prone)    Lie with upper arms straight out from sides, elbows bent to 90. Pinch shoulder blades together and raise arms a few inches from floor. Repeat 10____ times per set. Do _1-2___ sets per session. Do __3__ sessions per week

## 2017-11-10 NOTE — Therapy (Signed)
[facility] [place] [address] [place], [place], [address] Phone: [phone]   Fax:  [phone]  Physical Therapy Treatment  Patient Details  Name: Gloria Schneider MRN: [id number] Date of Birth: [birth year] Referring Provider (PT): Dr Koren Shiver    Encounter Date: 11/10/2017  PT End of Session - 11/10/17 1151    Visit Number  3    Number of Visits  12    Date for PT Re-Evaluation  12/12/17    PT Start Time  1106    PT Stop Time  1152    PT Time Calculation (min)  46 min    Activity Tolerance  Patient tolerated treatment well    Behavior During Therapy  [facility] for tasks assessed/performed       No past medical history on file.  No past surgical history on file.  There were no vitals filed for this visit.  Subjective Assessment - 11/10/17 1109    Subjective  "I'm doing a lot better.  But I'm not ready to go back to full duty.  I can't lift without pain".  Pt reporting 70% improvement.  She states she can leave work with less discomfort.     Patient Stated Goals  get rid of the Lt shoulder pain.    Currently in Pain?  No/denies    Pain Score  0-No pain    Pain Orientation  Left       [facility] Adult PT Treatment/Exercise - 11/10/17 0001      Exercises   Exercises  Shoulder      Shoulder Exercises: Supine   Other Supine Exercises  sash with green band x 10 reps x 2 sets, RUE x 1 set of 10      Shoulder Exercises: Prone   Other Prone Exercises  W's with axial ext - demo and cues for improved form.       Shoulder Exercises: Standing   External Rotation  Strengthening;Both;10 reps;Theraband    Theraband Level (Shoulder External Rotation)  Level 1 (Yellow)   2 sets   Extension  Strengthening;Both;10 reps;Theraband   2 sets   Theraband Level (Shoulder Extension)  Level 2 (Red)    Row  Strengthening;Both;10 reps;Theraband   with step back, simulating pulling pt up in bed.    Theraband Level (Shoulder Row)  Level 3 (Green)       Shoulder Exercises: ROM/Strengthening   Nustep  L5: arms/legs x 5 min       Shoulder Exercises: Stretch   Other Shoulder Stretches  3 way pec stretch 15-20 sec x 2 reps each position - improved form.       Manual Therapy   Manual Therapy  Taping;Soft tissue mobilization    Soft tissue mobilization  IASTM with edge tool to Lt  rhombod and posterior shoulder to decrease fascial restrictions and pain.     Kinesiotex  IT consultant  I strip of reg Rock tape applied to [employer] with 20% stretch to decompress area and decrease pain.              PT Education - 11/10/17 1156    Education Details  kinesiotape info; HEP     Person(s) Educated  Patient    Methods  Explanation;Handout;Verbal cues    Comprehension  Verbalized understanding;Returned demonstration          PT Long Term Goals - 10/31/17 1236  PT LONG TERM GOAL #1   Title  Decrease pain in Lt thoracic area by 50-75% allowing patient to return to normal funcitonal and work activities 12/12/17    Time  6    Period  Weeks    Status  New      PT LONG TERM GOAL #2   Title  Full pain free ROM through bilat UE's and cervical spine 12/12/17    Time  6    Period  Weeks    Status  New      PT LONG TERM GOAL #3   Title  Improve posture and alignment with patient to demonstrate improved upright posture with posterior shoudler girdle engaged 12/12/17    Time  6    Period  Weeks    Status  New      PT LONG TERM GOAL #4   Title  Independent in HEP 12/12/17    Time  6    Period  Weeks      PT LONG TERM GOAL #5   Title  Improve FOTO to </= 34% limitation 12/12/17    Time  6    Period  Weeks    Status  New            Plan - 11/10/17 1111    Clinical Impression Statement  Pt reporting improved compliance with stretches of HEP; noting improvement in symptoms.  Pt requires frequent cues for improved form and posture throughout session. She tolerated all exercises  well, with minimal increase in symptoms.  Trial of IASTM and Rock tape to Lt rhomboid area.      Rehab Potential  Good    PT Frequency  2x / week    PT Duration  6 weeks    PT Treatment/Interventions  Patient/family education;ADLs/Self Care Home Management;Cryotherapy;Electrical Stimulation;Iontophoresis 4mg /ml Dexamethasone;Moist Heat;Ultrasound;Dry needling;Manual techniques;Neuromuscular re-education;Therapeutic activities;Fluidtherapy;Therapeutic exercise    PT Next Visit Plan  assess response to [facility] tape.  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  -assess goals.    PT Home Exercise Plan  [id number]    Consulted and Agree with Plan of Care  Patient       Patient will benefit from skilled therapeutic intervention in order to improve the following deficits and impairments:  Postural dysfunction, Improper body mechanics, Pain, Increased fascial restricitons, Increased muscle spasms, Decreased scar mobility, Decreased range of motion, Decreased activity tolerance, Decreased endurance  Visit Diagnosis: Pain in thoracic spine  Other symptoms and signs involving the musculoskeletal system  Abnormal posture     Problem List There are no active problems to display for this patient.  Mayer Camel, PTA 11/10/17 12:22 PM  [facility] [place] [address] [place], [place], [address] Phone: [phone]   Fax:  [phone]  Name: Gloria Schneider MRN: [id number] Date of Birth: 09-28-1995

## 2017-11-11 ENCOUNTER — Encounter: Payer: Self-pay | Admitting: Rehabilitative and Restorative Service Providers"

## 2017-11-11 ENCOUNTER — Ambulatory Visit (INDEPENDENT_AMBULATORY_CARE_PROVIDER_SITE_OTHER): Payer: No Typology Code available for payment source | Admitting: Rehabilitative and Restorative Service Providers"

## 2017-11-11 DIAGNOSIS — M546 Pain in thoracic spine: Secondary | ICD-10-CM | POA: Diagnosis not present

## 2017-11-11 DIAGNOSIS — R293 Abnormal posture: Secondary | ICD-10-CM | POA: Diagnosis not present

## 2017-11-11 DIAGNOSIS — R29898 Other symptoms and signs involving the musculoskeletal system: Secondary | ICD-10-CM

## 2017-11-11 NOTE — Therapy (Signed)
[facility] [place] [address] [place], [place], [address] Phone: [phone]   Fax:  [phone]  Physical Therapy Treatment  Patient Details  Name: Gloria Schneider MRN: [id number] Date of Birth: Apr 09, 1995 Referring Provider (PT): Dr Koren Shiver    Encounter Date: 11/11/2017  PT End of Session - 11/11/17 1104    Visit Number  4    Number of Visits  12    Date for PT Re-Evaluation  12/12/17    PT Start Time  1101    PT Stop Time  1157    PT Time Calculation (min)  56 min    Activity Tolerance  Patient tolerated treatment well       History reviewed. No pertinent past medical history.  History reviewed. No pertinent surgical history.  There were no vitals filed for this visit.  Subjective Assessment - 11/11/17 1108    Subjective  Patient reports that the tape helps but it is itchy. It seemed to help. She has some tingling in the mid back area as well as continued tightness in that area. She is working on her exercises at home.     Currently in Pain?  No/denies    Pain Location  Thoracic    Pain Orientation  Left    Pain Descriptors / Indicators  Tightness;Tingling    Pain Type  Acute pain    Pain Onset  More than a month ago    Pain Frequency  Intermittent                       [facility] Adult PT Treatment/Exercise - 11/11/17 0001      Shoulder Exercises: Prone   Other Prone Exercises  prone prop with scapular protraction/retraction x 5     Other Prone Exercises  prone series axial extension; arms at side; W's; airplanes; superman 2-3 sec hold x 5 each       Shoulder Exercises: Standing   Extension  Strengthening;Both;10 reps;Theraband   2 sets   Theraband Level (Shoulder Extension)  Level 2 (Red)    Row  Strengthening;Both;10 reps;Theraband   with step back, simulating pulling pt up in bed.    Theraband Level (Shoulder Row)  Level 3 (Green)    Retraction  Strengthening;Right;Left;15 reps;Theraband     Theraband Level (Shoulder Retraction)  Level 2 (Red)    Other Standing Exercises  scap squeeze 10 sec x 10; chin tuck  5 sec x 10;  W's x 10 with noodle       Shoulder Exercises: ROM/Strengthening   UBE (Upper Arm Bike)  L2 x 4 min alternating fwd/back       Shoulder Exercises: Stretch   Other Shoulder Stretches  3 way pec stretch 15-20 sec x 2 reps each position      Moist Heat Therapy   Number Minutes Moist Heat  15 Minutes    Moist Heat Location  --   thoracic      Manual Therapy   Soft tissue mobilization  deep tissue work Rt posterior thoracic musculature     Kinesiotex  --   tape in place fromyesterday - will try for 3-4 more days             PT Education - 11/11/17 1128    Education Details  HEP     Person(s) Educated  Patient    Methods  Explanation;Demonstration;Tactile cues;Verbal cues;Handout    Comprehension  Verbalized understanding;Returned demonstration;Verbal cues required;Tactile cues  required          PT Long Term Goals - 10/31/17 1236      PT LONG TERM GOAL #1   Title  Decrease pain in Lt thoracic area by 50-75% allowing patient to return to normal funcitonal and work activities 12/12/17    Time  6    Period  Weeks    Status  New      PT LONG TERM GOAL #2   Title  Full pain free ROM through bilat UE's and cervical spine 12/12/17    Time  6    Period  Weeks    Status  New      PT LONG TERM GOAL #3   Title  Improve posture and alignment with patient to demonstrate improved upright posture with posterior shoudler girdle engaged 12/12/17    Time  6    Period  Weeks    Status  New      PT LONG TERM GOAL #4   Title  Independent in HEP 12/12/17    Time  6    Period  Weeks      PT LONG TERM GOAL #5   Title  Improve FOTO to </= 34% limitation 12/12/17    Time  6    Period  Weeks    Status  New            Plan - 11/11/17 1104    Clinical Impression Statement   Continued gradual imporvement with decreased pain and improved tissue  extensibility and exercise tolerance.     Rehab Potential  Good    PT Frequency  2x / week    PT Duration  6 weeks    PT Treatment/Interventions  Patient/family education;ADLs/Self Care Home Management;Cryotherapy;Electrical Stimulation;Iontophoresis 4mg /ml Dexamethasone;Moist Heat;Ultrasound;Dry needling;Manual techniques;Neuromuscular re-education;Therapeutic activities;Fluidtherapy;Therapeutic exercise    PT Next Visit Plan  assess response to [facility] tape.  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     PT Home Exercise Plan  [id number]    Consulted and Agree with Plan of Care  Patient       Patient will benefit from skilled therapeutic intervention in order to improve the following deficits and impairments:  Postural dysfunction, Improper body mechanics, Pain, Increased fascial restricitons, Increased muscle spasms, Decreased scar mobility, Decreased range of motion, Decreased activity tolerance, Decreased endurance  Visit Diagnosis: Pain in thoracic spine  Other symptoms and signs involving the musculoskeletal system  Abnormal posture     Problem List There are no active problems to display for this patient.   Celyn Rober Minion PT, MPH  11/11/2017, 11:49 AM  [facility] [address] [place], [place], [address] Phone: [phone]   Fax:  [phone]  Name: Gloria Schneider MRN: [id number] Date of Birth: [birth year]

## 2017-11-11 NOTE — Patient Instructions (Addendum)
Start with 5 reps each then increase to 10 reps then 2 sets of 10 each    shoulder Blade Squeeze: Arms at Sides    Arms at sides, parallel, elbows straight, palms up. Press pelvis down. Squeeze backbone with shoulder blades, raising front of shoulders, chest, and arms. Keep head and neck neutral. Hold ___ seconds. Relax. Repeat ___ times.   Shoulder Blade Squeeze: W    Arms out to sides at 90 palms down. Bend elbows to 90. Press pelvis down. Squeeze backbone with shoulder blades. Raise arms, front of shoulders, chest, and head. Keep neck neutral. Hold ___ seconds. Relax. Repeat ___ times.   Shoulder Blade Squeeze: Airplane    Arms out to sides at 90, elbows straight, palms down. Press pelvis down. Squeeze backbone with shoulder blades. Raise arms, front of shoulders, chest, and head. Keep neck neutral. Hold ___ seconds. Relax. Repeat ___ times.   Shoulder Blade Squeeze: Superperson    Arms alongside head, elbows straight, palms down. Press pelvis down. Squeeze backbone with shoulder blades. Raise arms, chest, and head. Keep neck neutral. Hold ___ seconds. Relax. Repeat ___ times.    On Elbows (Prone)    Rise up on elbows round shoulders and flatten back - then sink into the floor/bed. Keeping hips on floor. Hold _2-3___ seconds. Repeat _5___ times per set. Do __1__ sets per session. Do __2__ sessions per day.

## 2017-11-15 ENCOUNTER — Ambulatory Visit (INDEPENDENT_AMBULATORY_CARE_PROVIDER_SITE_OTHER): Payer: No Typology Code available for payment source | Admitting: Rehabilitative and Restorative Service Providers"

## 2017-11-15 ENCOUNTER — Encounter: Payer: Self-pay | Admitting: Rehabilitative and Restorative Service Providers"

## 2017-11-15 DIAGNOSIS — R29898 Other symptoms and signs involving the musculoskeletal system: Secondary | ICD-10-CM

## 2017-11-15 DIAGNOSIS — R293 Abnormal posture: Secondary | ICD-10-CM | POA: Diagnosis not present

## 2017-11-15 DIAGNOSIS — M546 Pain in thoracic spine: Secondary | ICD-10-CM

## 2017-11-15 NOTE — Therapy (Signed)
[facility] [place] [address] [place], [place], [address] Phone: [phone]   Fax:  [phone]  Physical Therapy Treatment  Patient Details  Name: Gloria Schneider MRN: [id number] Date of Birth: 1995/10/29 Referring Provider (PT): Dr Koren Shiver    Encounter Date: 11/15/2017  PT End of Session - 11/15/17 0938    Visit Number  5    Number of Visits  12    Date for PT Re-Evaluation  12/12/17    PT Start Time  0935    PT Stop Time  1029    PT Time Calculation (min)  54 min    Activity Tolerance  Patient tolerated treatment well       History reviewed. No pertinent past medical history.  History reviewed. No pertinent surgical history.  There were no vitals filed for this visit.  Subjective Assessment - 11/15/17 0942    Subjective  Patient reports good improvement with taping. She has no swelling in that side of her back now and the tingling is improved. She feels the tightness "a little bit" but not as bad as it was. Pleased with progress.     Currently in Pain?  No/denies                       [facility] Adult PT Treatment/Exercise - 11/15/17 0001      Shoulder Exercises: Prone   Other Prone Exercises  prone prop with scapular protraction/retraction x 5     Other Prone Exercises  prone series axial extension; arms at side; W's; airplanes; superman 2-3 sec hold x 10 each       Shoulder Exercises: Standing   Extension  Strengthening;Both;10 reps;Theraband   2 sets   Theraband Level (Shoulder Extension)  Level 3 (Green)    Row  Strengthening;Both;10 reps;Theraband   with step back, simulating pulling pt up in bed.    Theraband Level (Shoulder Row)  Level 3 (Green)    Row Weight (lbs)  row 45 deg 10 x 2 sets     Row Limitations  bow and arrow stepping back 10 x 2(difficulty with coordinating)     Retraction  Strengthening;Right;Left;15 reps;Theraband    Theraband Level (Shoulder Retraction)  Level 3 (Green)       Shoulder Exercises: ROM/Strengthening   UBE (Upper Arm Bike)  L2 x 4 min alternating fwd/back       Shoulder Exercises: Stretch   Other Shoulder Stretches  3 way pec stretch 15-20 sec x 2 reps each position      Shoulder Exercises: Body Blade   Other Body Blade Exercises  scaption 30 sec x 3 each UE       Moist Heat Therapy   Number Minutes Moist Heat  15 Minutes    Moist Heat Location  --   thoracic      Electrical Stimulation   Electrical Stimulation Location  Lt posterior shoulder girdle/scapular area     Electrical Stimulation Action  IFC    Electrical Stimulation Parameters  to tolerance    Electrical Stimulation Goals  Pain;Tone      Manual Therapy   Soft tissue mobilization  deep tissue work Rt posterior thoracic musculature              PT Education - 11/15/17 1001    Education Details  HEP     Person(s) Educated  Patient    Methods  Explanation;Demonstration;Tactile cues;Verbal cues;Handout    Comprehension  Verbalized  understanding;Returned demonstration;Verbal cues required;Tactile cues required          PT Long Term Goals - 10/31/17 1236      PT LONG TERM GOAL #1   Title  Decrease pain in Lt thoracic area by 50-75% allowing patient to return to normal funcitonal and work activities 12/12/17    Time  6    Period  Weeks    Status  New      PT LONG TERM GOAL #2   Title  Full pain free ROM through bilat UE's and cervical spine 12/12/17    Time  6    Period  Weeks    Status  New      PT LONG TERM GOAL #3   Title  Improve posture and alignment with patient to demonstrate improved upright posture with posterior shoudler girdle engaged 12/12/17    Time  6    Period  Weeks    Status  New      PT LONG TERM GOAL #4   Title  Independent in HEP 12/12/17    Time  6    Period  Weeks      PT LONG TERM GOAL #5   Title  Improve FOTO to </= 34% limitation 12/12/17    Time  6    Period  Weeks    Status  New            Plan - 11/15/17 3875     Clinical Impression Statement  Patient reports decreased swelling in the back area as well as less tingling. Good response to kinesotaping. Progressing well with rehab. Increased exercise and functional activity tolerance. Less palpable tightness.     Rehab Potential  Good    PT Frequency  2x / week    PT Duration  6 weeks    PT Treatment/Interventions  Patient/family education;ADLs/Self Care Home Management;Cryotherapy;Electrical Stimulation;Iontophoresis 4mg /ml Dexamethasone;Moist Heat;Ultrasound;Dry needling;Manual techniques;Neuromuscular re-education;Therapeutic activities;Fluidtherapy;Therapeutic exercise    PT Next Visit Plan  continue Rock tape as indicated.  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     PT Home Exercise Plan  [id number]    Consulted and Agree with Plan of Care  Patient       Patient will benefit from skilled therapeutic intervention in order to improve the following deficits and impairments:  Postural dysfunction, Improper body mechanics, Pain, Increased fascial restricitons, Increased muscle spasms, Decreased scar mobility, Decreased range of motion, Decreased activity tolerance, Decreased endurance  Visit Diagnosis: Pain in thoracic spine  Other symptoms and signs involving the musculoskeletal system  Abnormal posture     Problem List There are no active problems to display for this patient.   Loni Abdon Rober Minion PT, MPH  11/15/2017, 10:15 AM  [facility] [address] [place], [place], [address] Phone: [phone]   Fax:  [phone]  Name: Gloria Schneider MRN: [id number] Date of Birth: [birth year]

## 2017-11-15 NOTE — Patient Instructions (Signed)
Scapular Retraction: Rowing (Eccentric) - Arms - 45 Degrees (Resistance Band)    Hold end of band in each hand. Pull back until elbows are even with trunk. Keep elbows out from sides at 45, thumbs up. Slowly release for 3-5 seconds. Use __green ______ resistance band. __10_ reps per set, __2-3_ sets per day   Body Blade

## 2017-11-16 ENCOUNTER — Ambulatory Visit (INDEPENDENT_AMBULATORY_CARE_PROVIDER_SITE_OTHER): Payer: No Typology Code available for payment source | Admitting: Physical Therapy

## 2017-11-16 ENCOUNTER — Encounter: Payer: Self-pay | Admitting: Physical Therapy

## 2017-11-16 DIAGNOSIS — M546 Pain in thoracic spine: Secondary | ICD-10-CM

## 2017-11-16 DIAGNOSIS — R293 Abnormal posture: Secondary | ICD-10-CM | POA: Diagnosis not present

## 2017-11-16 DIAGNOSIS — R29898 Other symptoms and signs involving the musculoskeletal system: Secondary | ICD-10-CM | POA: Diagnosis not present
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
## 2017-11-22 NOTE — Telephone Encounter (Signed)
Called and LVM to call office back if she has anymore questions at this time.

## 2017-11-24 ENCOUNTER — Encounter: Payer: Self-pay | Admitting: Rehabilitative and Restorative Service Providers"

## 2017-11-24 ENCOUNTER — Ambulatory Visit (INDEPENDENT_AMBULATORY_CARE_PROVIDER_SITE_OTHER): Payer: No Typology Code available for payment source | Admitting: Rehabilitative and Restorative Service Providers"

## 2017-11-24 DIAGNOSIS — R29898 Other symptoms and signs involving the musculoskeletal system: Secondary | ICD-10-CM

## 2017-11-24 DIAGNOSIS — R293 Abnormal posture: Secondary | ICD-10-CM

## 2017-11-24 DIAGNOSIS — M546 Pain in thoracic spine: Secondary | ICD-10-CM

## 2017-11-24 NOTE — Therapy (Signed)
[facility] [address] [place], [place], [address] Phone: [phone]   Fax:  [phone]  Patient Details  Name: Gloria Schneider MRN: [id number] Date of Birth: 04-Oct-1995 Referring Provider:  Myles Lipps, MD  Encounter Date: 11/24/2017   Val Riles PT, MPH  11/24/2017, 2:33 PM  [facility] [address] [place], [place], [address] Phone: [phone]   Fax:  [phone]

## 2017-11-25 ENCOUNTER — Encounter: Payer: Self-pay | Admitting: Physical Therapy

## 2017-11-25 ENCOUNTER — Ambulatory Visit (INDEPENDENT_AMBULATORY_CARE_PROVIDER_SITE_OTHER): Payer: No Typology Code available for payment source | Admitting: Physical Therapy

## 2017-11-25 DIAGNOSIS — M546 Pain in thoracic spine: Secondary | ICD-10-CM | POA: Diagnosis not present

## 2017-11-25 DIAGNOSIS — R29898 Other symptoms and signs involving the musculoskeletal system: Secondary | ICD-10-CM

## 2017-11-25 DIAGNOSIS — R293 Abnormal posture: Secondary | ICD-10-CM | POA: Diagnosis not present
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
## 2017-11-28 ENCOUNTER — Encounter

## 2017-12-01 ENCOUNTER — Ambulatory Visit (INDEPENDENT_AMBULATORY_CARE_PROVIDER_SITE_OTHER): Payer: No Typology Code available for payment source | Admitting: Physical Therapy

## 2017-12-01 ENCOUNTER — Encounter: Payer: Self-pay | Admitting: Physical Therapy

## 2017-12-01 DIAGNOSIS — R29898 Other symptoms and signs involving the musculoskeletal system: Secondary | ICD-10-CM | POA: Diagnosis not present

## 2017-12-01 DIAGNOSIS — M546 Pain in thoracic spine: Secondary | ICD-10-CM | POA: Diagnosis not present

## 2017-12-01 DIAGNOSIS — R293 Abnormal posture: Secondary | ICD-10-CM
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
## 2017-12-02 ENCOUNTER — Encounter: Payer: Self-pay | Admitting: Physical Therapy

## 2017-12-02 ENCOUNTER — Ambulatory Visit (INDEPENDENT_AMBULATORY_CARE_PROVIDER_SITE_OTHER): Payer: No Typology Code available for payment source | Admitting: Physical Therapy

## 2017-12-02 DIAGNOSIS — R29898 Other symptoms and signs involving the musculoskeletal system: Secondary | ICD-10-CM | POA: Diagnosis not present

## 2017-12-02 DIAGNOSIS — M546 Pain in thoracic spine: Secondary | ICD-10-CM | POA: Diagnosis not present

## 2017-12-02 DIAGNOSIS — R293 Abnormal posture: Secondary | ICD-10-CM | POA: Diagnosis not present
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
## 2017-12-09 ENCOUNTER — Encounter: Payer: Self-pay | Admitting: Family Medicine

## 2017-12-09 ENCOUNTER — Ambulatory Visit (INDEPENDENT_AMBULATORY_CARE_PROVIDER_SITE_OTHER): Payer: Worker's Compensation | Admitting: Family Medicine

## 2017-12-09 ENCOUNTER — Other Ambulatory Visit: Payer: Self-pay

## 2017-12-09 VITALS — BP 114/73 | HR 94 | Temp 98.1°F | Resp 16 | Ht 60.0 in | Wt 151.6 lb

## 2017-12-09 DIAGNOSIS — S29012D Strain of muscle and tendon of back wall of thorax, subsequent encounter: Secondary | ICD-10-CM | POA: Diagnosis not present

## 2017-12-09 NOTE — Progress Notes (Signed)
   11/1/20192:05 PM  ARLEATHA PHILIPPS Jun 28, 1995, 22 y.o. female [id number]  Chief Complaint  Patient presents with  . back pain on left side    workman's comp f/u, onset: 09/17/17    HPI:   Patient is a 22 y.o. female  who presents today for followup on her left upper back strain related to work related injury  Has been doing PT Last session 12/02/17, 10/12 Getting ready to transition onto HEP  Still on light duty at same place of employment Not lifting anymore than 10lbs   Fall Risk  12/09/2017 10/05/2017 09/20/2017 05/05/2017  Falls in the past year? 0 No No No     Depression screen [facility] 2/9 12/09/2017 10/05/2017 09/20/2017  Decreased Interest 0 0 0  Down, Depressed, Hopeless 0 0 0  PHQ - 2 Score 0 0 0    No Known Allergies  Prior to Admission medications   Medication Sig Start Date End Date Taking? Authorizing Provider  Multiple Vitamin (MULTIVITAMIN) capsule Take 2 capsules by mouth daily. Gummy-vites    [provider]    No past medical history on file.  No past surgical history on file.  Social History   Tobacco Use  . Smoking status: Never Smoker  . Smokeless tobacco: Never Used  Substance Use Topics  . Alcohol use: No    No family history on file.  ROS Per hpi  OBJECTIVE:  Blood pressure 114/73, pulse 94, temperature 98.1 F (36.7 C), temperature source Oral, resp. rate 16, height 5' (1.524 m), weight 151 lb 9.6 oz (68.8 kg), last menstrual period 11/23/2017, SpO2 99 %. Body mass index is 29.61 kg/m.   Physical Exam  Gen: AAOx3, NAD Thoracic spine none tender, + right medial scapular border with small spasm, no tenderness nor spasms on left upper back   ASSESSMENT and PLAN  1. Muscle strain of left upper back, subsequent encounter Significantly improved. No pain with minimal activity. Will release to 25lbs max weight, if still pain free at next visit, then will release to full duties wo restrictions  Return in about 3 weeks (around  12/30/2017).    Myles Lipps, MD Primary Care at [facility] [address] [place], [place] [address] Ph.  [phone] Fax [phone]

## 2017-12-09 NOTE — Patient Instructions (Signed)
° ° ° °  If you have lab work done today you will be contacted with your lab results within the next 2 weeks.  If you have not heard from us then please contact us. The fastest way to get your results is to register for My Chart. ° ° °IF you received an x-ray today, you will receive an invoice from [facility]. Please contact [facility] at [phone] with questions or concerns regarding your invoice.  ° °IF you received labwork today, you will receive an invoice from [facility]. Please contact [facility] at [phone] with questions or concerns regarding your invoice.  ° °Our billing staff will not be able to assist you with questions regarding bills from these companies. ° °You will be contacted with the lab results as soon as they are available. The fastest way to get your results is to activate your My Chart account. Instructions are located on the last page of this paperwork. If you have not heard from us regarding the results in 2 weeks, please contact this office. °  ° ° ° °

## 2017-12-29 ENCOUNTER — Ambulatory Visit (INDEPENDENT_AMBULATORY_CARE_PROVIDER_SITE_OTHER): Payer: Worker's Compensation | Admitting: Family Medicine

## 2017-12-29 ENCOUNTER — Encounter: Payer: Self-pay | Admitting: Family Medicine

## 2017-12-29 ENCOUNTER — Ambulatory Visit: Payer: Worker's Compensation

## 2017-12-29 VITALS — BP 124/86 | HR 72 | Temp 98.5°F | Ht 60.0 in | Wt 145.2 lb

## 2017-12-29 DIAGNOSIS — S29012D Strain of muscle and tendon of back wall of thorax, subsequent encounter: Secondary | ICD-10-CM

## 2017-12-29 DIAGNOSIS — M549 Dorsalgia, unspecified: Secondary | ICD-10-CM | POA: Diagnosis not present

## 2017-12-29 NOTE — Progress Notes (Signed)
   11/21/201911:54 AM  Dimitri PedWiata M Jurgensen 16-Oct-1995, 22 y.o. female [id number]  Chief Complaint  Patient presents with  . upper back pain    HPI:   Patient is a 22 y.o. 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 0 0 No No No     [facility] 2/9 12/29/2017 12/09/2017 10/05/2017  Decreased Interest 0 0 0  Down, Depressed, Hopeless 0 0 0  PHQ - 2 Score 0 0 0    No Known Allergies  Prior to Admission medications   Medication Sig Start Date End Date Taking? Authorizing Provider  Multiple Vitamin (MULTIVITAMIN) capsule Take 2 capsules by mouth daily. Gummy-vites   Yes [provider]    No past medical history on file.  No past surgical history on file.  Social History   Tobacco Use  . Smoking status: Never Smoker  . Smokeless tobacco: Never Used  Substance Use Topics  . Alcohol use: No    No family history on file.  ROS Per hpi  OBJECTIVE:  Blood pressure 124/86, pulse 72, temperature 98.5 F (36.9 C), temperature source Oral, height 5' (1.524 m), weight 145 lb 3.2 oz (65.9 kg), last menstrual period 12/13/2017, SpO2 98 %. Body mass index is 28.36 kg/m.   Physical Exam  Constitutional: She is oriented to person, place, and time. She appears well-developed and well-nourished.  HENT:  Head: Normocephalic and atraumatic.  Mouth/Throat: Mucous membranes are normal.  Eyes: Pupils are equal, round, and reactive to light. Conjunctivae and EOM are normal. No scleral icterus.  Neck: Neck supple.  Pulmonary/Chest: Effort normal.  Neurological: She is  alert and oriented to person, place, and time.  Skin: Skin is warm and dry.  Psychiatric: She has a normal mood and affect.  Nursing note and vitals reviewed.    ASSESSMENT and PLAN  1. Upper back pain on left side 2. Muscle strain of left upper back, subsequent encounter Patient did well with return to work trial. Has reached maximal medical improvement. Return to work without restrictions. Continue with HEP.  Return if symptoms worsen or fail to improve.    Myles LippsIrma M Santiago, MD [facility] at [facility] [address] [place], [place] [address] Ph.  [phone] Fax [phone]

## 2017-12-29 NOTE — Patient Instructions (Signed)
° ° ° °  If you have lab work done today you will be contacted with your lab results within the next 2 weeks.  If you have not heard from us then please contact us. The fastest way to get your results is to register for My Chart. ° ° °IF you received an x-ray today, you will receive an invoice from [facility]. Please contact [facility] at [phone] with questions or concerns regarding your invoice.  ° °IF you received labwork today, you will receive an invoice from [facility]. Please contact [facility] at [phone] with questions or concerns regarding your invoice.  ° °Our billing staff will not be able to assist you with questions regarding bills from these companies. ° °You will be contacted with the lab results as soon as they are available. The fastest way to get your results is to activate your My Chart account. Instructions are located on the last page of this paperwork. If you have not heard from us regarding the results in 2 weeks, please contact this office. °  ° ° ° °

## 2019-04-20 NOTE — Telephone Encounter (Signed)
No action needed
